# Patient Record
Sex: Female | Born: 1960 | Race: Black or African American | Hispanic: No | Marital: Married | Smoking: Current every day smoker
Health system: Southern US, Community
[De-identification: ages and names within clinical notes are randomized; demographics above are authoritative.]

---

## 2006-02-28 ENCOUNTER — Emergency Department: Payer: Self-pay | Admitting: Emergency Medicine

## 2010-03-08 ENCOUNTER — Emergency Department: Payer: Self-pay | Admitting: Emergency Medicine

## 2010-09-04 ENCOUNTER — Emergency Department: Payer: Self-pay | Admitting: Emergency Medicine

## 2010-12-11 IMAGING — CT CT HEAD WITHOUT CONTRAST
3 of 4 series · 16 of 30 positions shown, 19 images · non-contrast
Comparison: none

REASON FOR EXAM: trauma
COMMENTS:

PROCEDURE:     CT  - CT HEAD WITHOUT CONTRAST  - March 08, 2010  [DATE]
RESULT:     Comparison:  None
TECHNIQUE: Multiple axial images from the foramen magnum to the vertex were
obtained without IV contrast.

[Series 2: without · axial · non-contrast · 0.44mm/px · z∈[-639,-519]mm · 9 of 30 slices shown, 12 images (1 of 2)]
[im 3/30  brain]
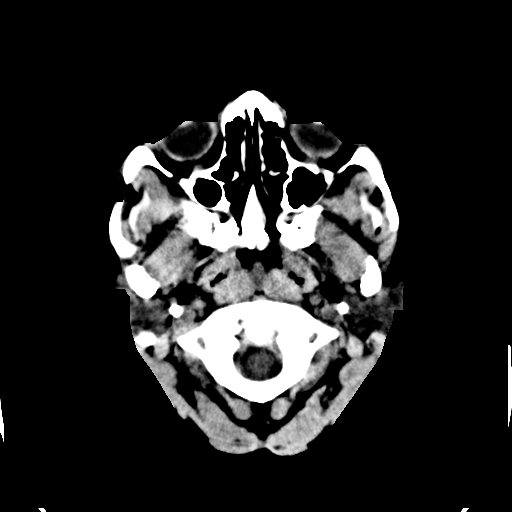
[im 3/30  bone]
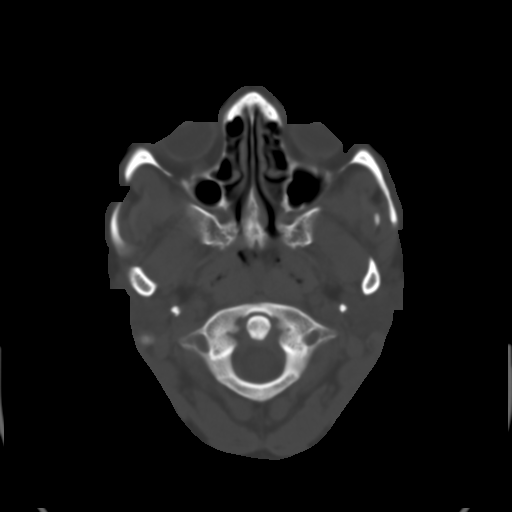
[im 6/30  brain]
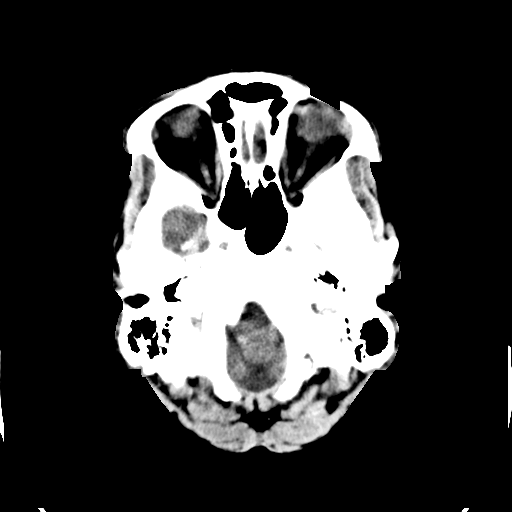
[im 9/30  brain]
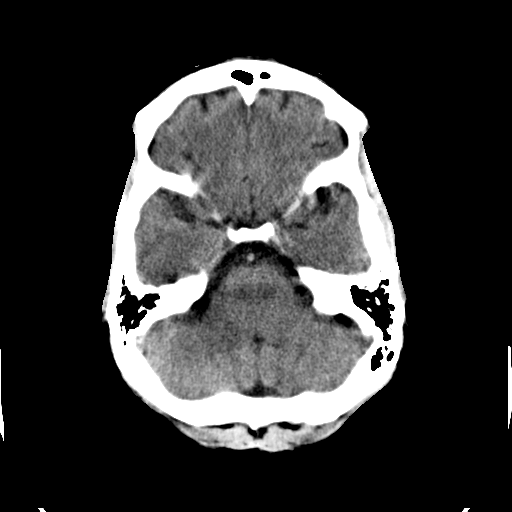
[im 12/30  brain]
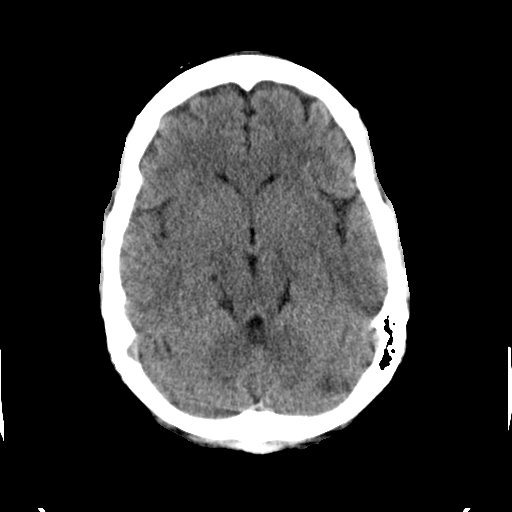
[im 15/30  brain]
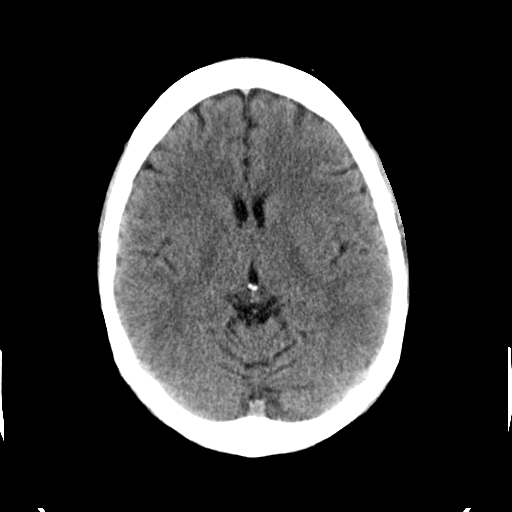
[im 15/30  bone]
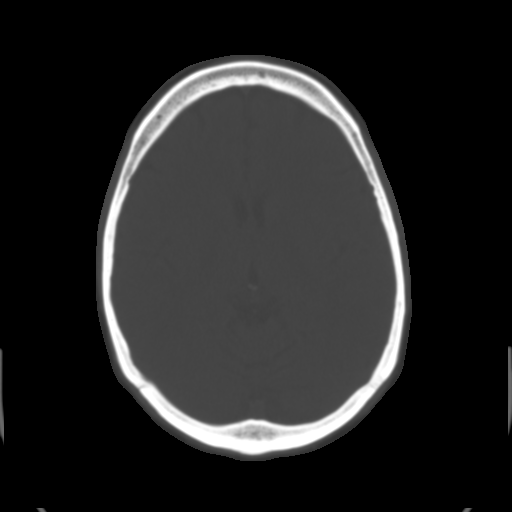
[im 18/30  brain]
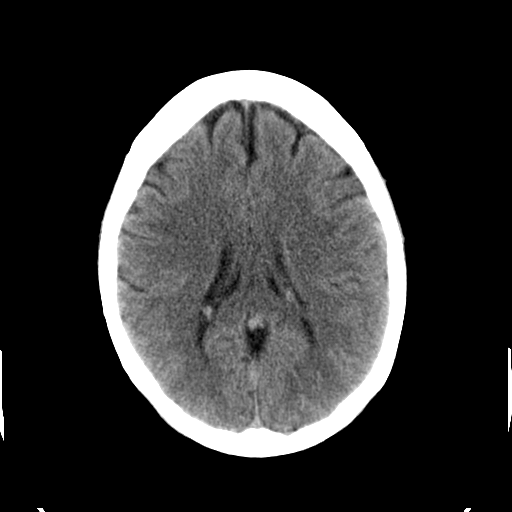
[im 21/30  brain]
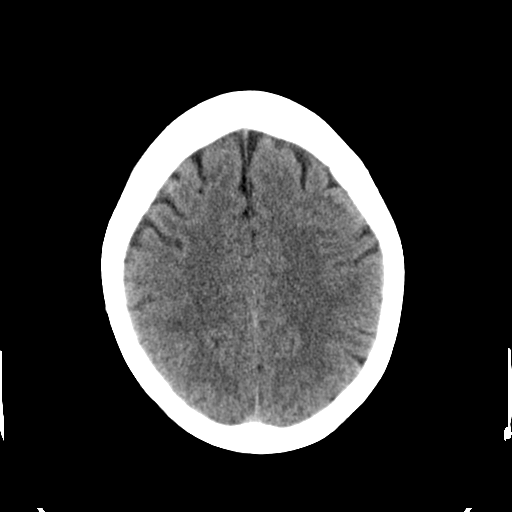
[im 24/30  brain]
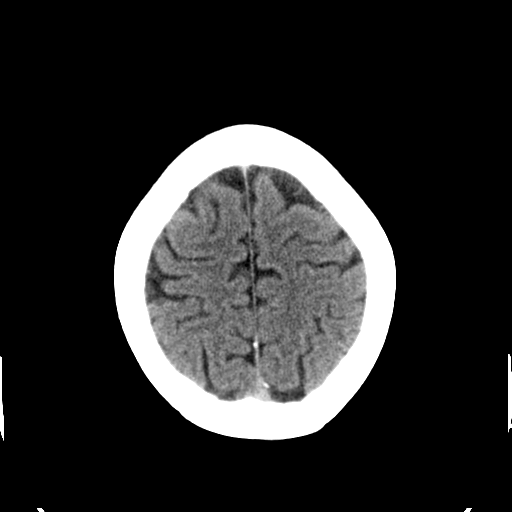
[im 27/30  brain]
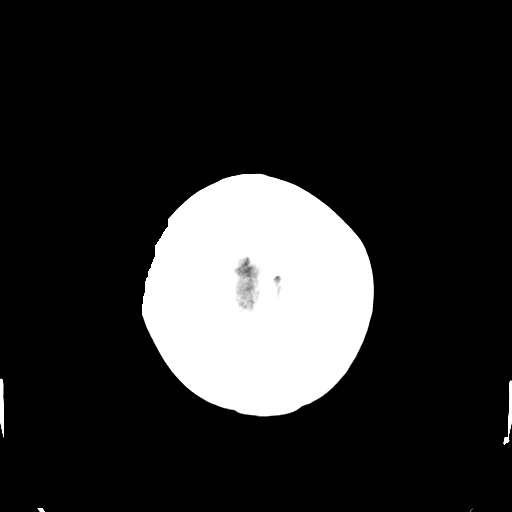
[im 27/30  bone]
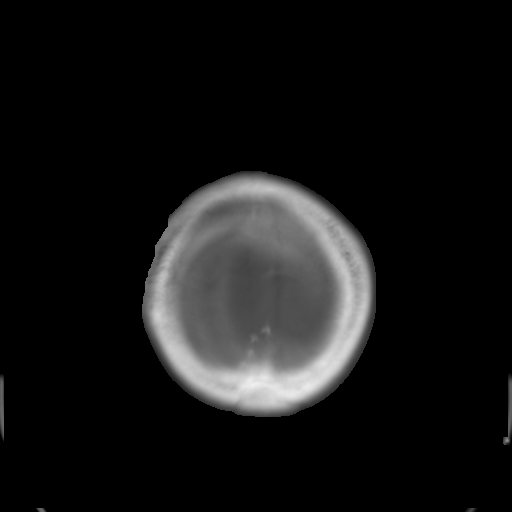

[Series 3: bone · axial · 0.44mm/px · z∈[-634,-559]mm · 5 of 31 slices shown]
[im 4/31  bone]
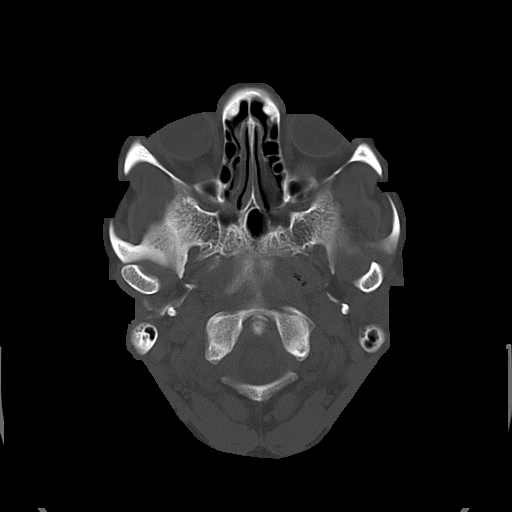
[im 7/31  bone]
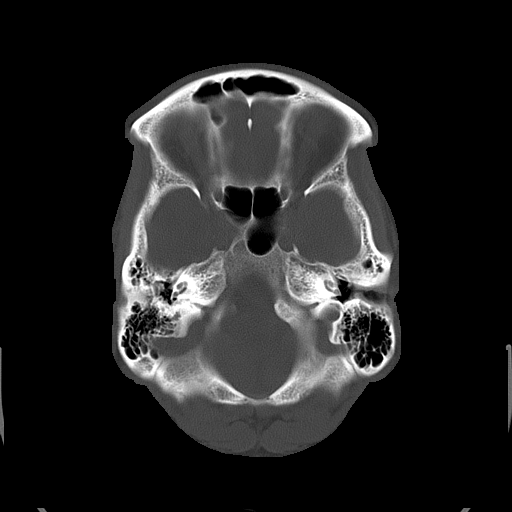
[im 10/31  bone]
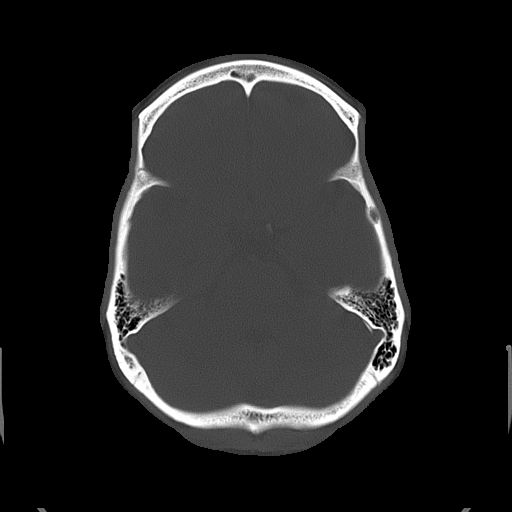
[im 13/31  bone]
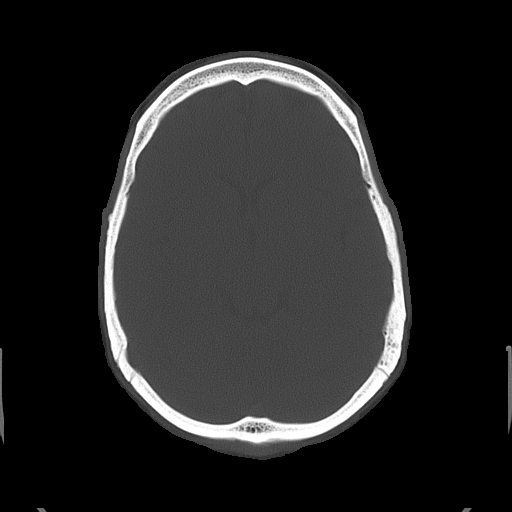
[im 19/31  bone]
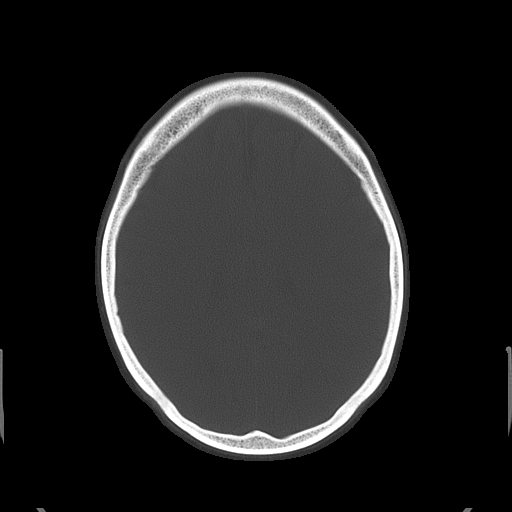

[Series 4: without · axial · non-contrast · 0.44mm/px · z∈[-536,-522]mm · 2 of 11 slices shown (2 of 2)]
[im 4/11  brain]
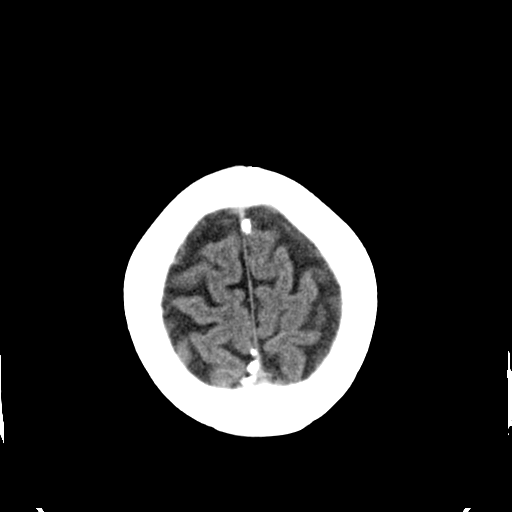
[im 7/11  brain]
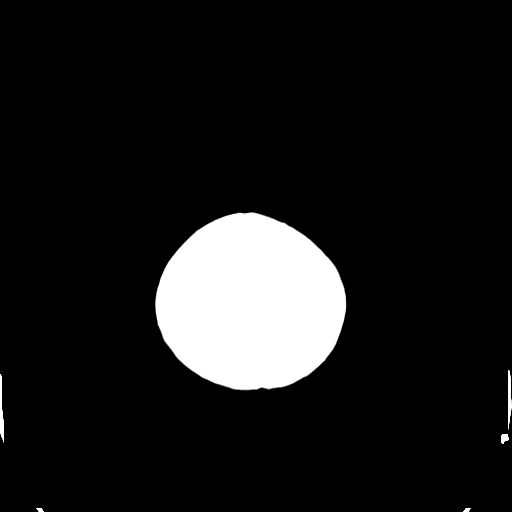

[16 of 30 positions shown; findings below may reference images not displayed]

FINDINGS: There is no evidence of mass effect, midline shift, or extra-axial fluid
collections.  There is no evidence of a space-occupying lesion or
intracranial hemorrhage. There is no evidence of a cortical-based area of
acute infarction.

The ventricles and sulci are appropriate for the patient's age. The basal
cisterns are patent.

Visualized portions of the orbits are unremarkable. The visualized portions
of the paranasal sinuses and mastoid air cells are unremarkable.

The osseous structures are unremarkable.
IMPRESSION: No acute intracranial process.

## 2014-11-08 ENCOUNTER — Emergency Department
Admit: 2014-11-08 | Disposition: A | Discharge status: Home / Self Care | Payer: Self-pay | Admitting: Emergency Medicine

## 2018-10-16 ENCOUNTER — Encounter: Payer: Self-pay | Admitting: Emergency Medicine

## 2018-10-16 ENCOUNTER — Ambulatory Visit
Admission: EM | Admit: 2018-10-16 | Discharge: 2018-10-16 | Disposition: A | Discharge status: Home / Self Care | Payer: PRIVATE HEALTH INSURANCE | Attending: Emergency Medicine | Admitting: Emergency Medicine

## 2018-10-16 ENCOUNTER — Other Ambulatory Visit: Payer: Self-pay

## 2018-10-16 DIAGNOSIS — H6123 Impacted cerumen, bilateral: Secondary | ICD-10-CM

## 2018-10-16 DIAGNOSIS — R590 Localized enlarged lymph nodes: Secondary | ICD-10-CM | POA: Diagnosis not present

## 2018-10-16 NOTE — Discharge Instructions (Addendum)
Here is a list of primary care providers who are taking new patients: ° °Dr. Deanna Jones, Dr. William Plonk °[address] °[address] °[place] [address] °[phone] ° °[facility] [place] °[address] [place] [address]  °[place] [address]  °[phone] ° °[facility] °[address]  °[place], [place] [address] °[phone] ° °[facility] [place] °[address]  °[phone] °[place], [place] [address] ° °Here are clinics/ other resources who will see you if you do not have insurance. Some have certain criteria that you must meet. Call them and find out what they are: ° °[facility]: °[address] [place] [address] [place], [place] [address] °Phone: [phone] °Hours: First and Third Saturdays of each Month, 9 a.m. - 1 p.m. ° °[facility]: °[address], [place], [place] [address] °Phone: [phone] °Hours: °Tuesday, 4 p.m. - 8 p.m. °Thursday, 1 p.m. - 8 p.m. °Wednesday, 9 a.m. - Noon ° °[facility] °[address], [place], [place] [address] °Phone: [phone] °Pharmacy Phone Number: [phone] °Dental Phone Number: [phone] °[employer] Help: [phone] ° °Dental Hours: °Monday - Thursday, 8 a.m. - 6 p.m. ° °[facility] °[address] [place], [place] [address] °Phone: [phone] °Pharmacy Phone Number: [phone] °[employer] Help: [phone] ° °[facility] °[address] [place], [place] [address] °Phone: [phone] °Pharmacy Phone Number: [phone] °[employer] Help: [phone] ° °[facility] °[address] [place], [place] [address] °Phone: [phone] °[employer] Help: [phone]  ° °[facility] °[address] , [place] [address] °Phone: [phone] ° °Go to www.goodrx.com to look up your medications. This will give you a list of where you can find your prescriptions at the most affordable prices. Or ask the pharmacist what the cash price is,  or if they have any other discount programs available to help make your medication more affordable. This can be less expensive than what you would pay with insurance.   °

## 2018-10-16 NOTE — ED Triage Notes (Signed)
Patient c/o hearing loss that started 2 weeks ago. Daughter states she woke up this morning with swollen lymph nodes in her neck.

## 2018-10-16 NOTE — ED Provider Notes (Signed)
HPI  SUBJECTIVE:  Aimee Harmon is a 58 y.o. female who presents with 2 issues.  First, she reports decreased hearing in both her ears for the past 2 weeks.  She uses Q-tips.  No aggravating or alleviating factors.  She has not tried anything for this.  She denies nasal congestion, rhinorrhea, fevers, ear pain, otorrhea.  She denies other foreign body insertion.  She has had symptoms like this before with previous episodes of cerumen impaction.  Second, she reports painless bilateral cervical lymphadenopathy starting today.  She has been in her usual state of health.  No sore throat, dental pain or infection, oral ulcers, other facial or neck infections, neck stiffness, fevers.  No unintentional weight loss, night sweats.  No aggravating or alleviating factors.  She has not tried anything for this.  Past medical history negative for diabetes, hypertension, HIV, cancer, multiple myeloma.  She is a smoker.  PMD: None.    History reviewed. No pertinent past medical history.  History reviewed. No pertinent surgical history.  History reviewed. No pertinent family history.  Social History   Tobacco Use  . Smoking status: Current Every Day Smoker  . Smokeless tobacco: Never Used  Substance Use Topics  . Alcohol use: Yes  . Drug use: Never    No current facility-administered medications for this encounter.  No current outpatient medications on file.  No Known Allergies   ROS  As noted in HPI.   Physical Exam  BP 136/77 (BP Location: Right Arm)   Pulse 68   Temp 97.6 F (36.4 C) (Oral)   Resp 18   Ht 5' 4"  (1.626 m)   Wt 61.2 kg   SpO2 100%   BMI 23.17 kg/m   Constitutional: Well developed, well nourished, no acute distress Eyes:  EOMI, conjunctiva normal bilaterally HENT: Normocephalic, atraumatic,mucus membranes moist. bilateral cerumen impaction.  Tongue normal.  Patient largely edentulous.  No gingival swelling or tenderness.  Remaining teeth nontender, appear intact.   No expressible purulent drainage from Wharton's ducts.  Tonsils normal without exudates.  Oropharynx normal.  Uvula midline.   Lymph: Two enlarged nontender bilateral cervical lymph nodes.  No preauricular, postauricular, posterior cervical, supraclavicular, inguinal lymphadenopathy. Respiratory: Normal inspiratory effort Cardiovascular: Normal rate GI: nondistended no palpable spleen. skin: No rash, skin intact Musculoskeletal: no deformities Neurologic: Alert & oriented x 3, no focal neuro deficits Psychiatric: Speech and behavior appropriate   ED Course   Medications - No data to display  Orders Placed This Encounter  Procedures  . Ear Cerumen Removal    bilateral    Standing Status:   Standing    Number of Occurrences:   1    No results found for this or any previous visit (from the past 24 hour(s)). No results found.  ED Clinical Impression  Bilateral hearing loss due to cerumen impaction  Cervical lymphadenopathy   ED Assessment/Plan  1.  Hearing loss secondary to cerumen impaction.  Will have both ears irrigated and reevaluate.  Reevaluation, patient states hearing is fully restored.  Bilateral TMs visualized.  No perforation.  2.  Bilateral cervical lymphadenopathy.  Does not appear to be a dental infection, pharyngitis, oral malignancy.  She has no other lymphadenopathy.  Denies recent viral illness.  Will have them observe this and follow-up with Dr. Kathyrn Sheriff, ENT on-call if this has not resolved in 2-4 weeks.  Also provide primary care referral list.  Discussed  MDM, treatment plan, and plan for follow-up with Daughter and patient.  They agree with plan.   No orders of the defined types were placed in this encounter.   *This clinic note was created using Dragon dictation software. Therefore, there may be occasional mistakes despite careful proofreading.   ?    Melynda Ripple, MD 10/17/18 [phone]
